# Patient Record
Sex: Female | Born: 1985 | Race: Black or African American | Hispanic: No | Marital: Married | Smoking: Former smoker
Health system: Southern US, Community
[De-identification: ages and names within clinical notes are randomized; demographics above are authoritative.]

## PROBLEM LIST (undated history)

## (undated) DIAGNOSIS — E039 Hypothyroidism, unspecified: Secondary | ICD-10-CM

## (undated) HISTORY — PX: WISDOM TOOTH EXTRACTION: SHX21

---

## 2015-03-09 LAB — OB RESULTS CONSOLE RPR: RPR: NONREACTIVE

## 2015-03-09 LAB — OB RESULTS CONSOLE RUBELLA ANTIBODY, IGM: Rubella: IMMUNE

## 2015-03-09 LAB — OB RESULTS CONSOLE GC/CHLAMYDIA
Chlamydia: NEGATIVE
Gonorrhea: NEGATIVE

## 2015-03-09 LAB — OB RESULTS CONSOLE ABO/RH: RH Type: POSITIVE

## 2015-03-09 LAB — OB RESULTS CONSOLE HEPATITIS B SURFACE ANTIGEN: Hepatitis B Surface Ag: NEGATIVE

## 2015-03-09 LAB — OB RESULTS CONSOLE HIV ANTIBODY (ROUTINE TESTING): HIV: NONREACTIVE

## 2015-03-09 LAB — OB RESULTS CONSOLE ANTIBODY SCREEN: ANTIBODY SCREEN: NEGATIVE

## 2015-04-18 ENCOUNTER — Other Ambulatory Visit (HOSPITAL_COMMUNITY): Payer: Self-pay | Admitting: Obstetrics and Gynecology

## 2015-04-18 DIAGNOSIS — Z3689 Encounter for other specified antenatal screening: Secondary | ICD-10-CM

## 2015-04-18 DIAGNOSIS — Z3A26 26 weeks gestation of pregnancy: Secondary | ICD-10-CM

## 2015-04-18 DIAGNOSIS — O283 Abnormal ultrasonic finding on antenatal screening of mother: Secondary | ICD-10-CM

## 2015-04-20 ENCOUNTER — Ambulatory Visit (HOSPITAL_COMMUNITY): Payer: Self-pay

## 2015-05-01 ENCOUNTER — Ambulatory Visit (HOSPITAL_COMMUNITY)
Admission: RE | Admit: 2015-05-01 | Discharge: 2015-05-01 | Disposition: A | Discharge status: Home / Self Care | Payer: BLUE CROSS/BLUE SHIELD | Source: Ambulatory Visit | Attending: Obstetrics and Gynecology | Admitting: Obstetrics and Gynecology

## 2015-05-01 ENCOUNTER — Encounter (HOSPITAL_COMMUNITY): Payer: Self-pay

## 2015-05-01 DIAGNOSIS — O358XX0 Maternal care for other (suspected) fetal abnormality and damage, not applicable or unspecified: Secondary | ICD-10-CM | POA: Insufficient documentation

## 2015-05-01 DIAGNOSIS — Z3689 Encounter for other specified antenatal screening: Secondary | ICD-10-CM

## 2015-05-01 DIAGNOSIS — Z36 Encounter for antenatal screening of mother: Secondary | ICD-10-CM | POA: Insufficient documentation

## 2015-05-01 DIAGNOSIS — Z3A29 29 weeks gestation of pregnancy: Secondary | ICD-10-CM | POA: Diagnosis not present

## 2015-05-01 DIAGNOSIS — O99283 Endocrine, nutritional and metabolic diseases complicating pregnancy, third trimester: Secondary | ICD-10-CM | POA: Diagnosis not present

## 2015-05-01 DIAGNOSIS — Z3A26 26 weeks gestation of pregnancy: Secondary | ICD-10-CM

## 2015-05-01 DIAGNOSIS — O283 Abnormal ultrasonic finding on antenatal screening of mother: Secondary | ICD-10-CM

## 2015-05-01 HISTORY — DX: Hypothyroidism, unspecified: E03.9

## 2015-05-21 NOTE — L&D Delivery Note (Signed)
Delivery Note At 10:55 AM a viable female, "Mallory Munoz", was delivered via Vaginal, Spontaneous Delivery (Presentation: ; Occiput Anterior).  APGAR: 9, 9; weight  .   Placenta status: Intact, Spontaneous.  Cord: 3 vessels with the following complications: None.  Cord pH: NA  Anesthesia: Epidural  Episiotomy: None Lacerations: None Suture Repair: None Est. Blood Loss (mL): 50  Mom to postpartum.  Baby to Couplet care / Skin to Skin. Family plans inpatient circumcision. Patient plans Mirena for contraception.  Rickayla Wieland 07/24/2015, 11:25 AM

## 2015-06-27 LAB — OB RESULTS CONSOLE GBS: STREP GROUP B AG: POSITIVE

## 2015-07-23 ENCOUNTER — Inpatient Hospital Stay (HOSPITAL_COMMUNITY)
Admission: AD | Admit: 2015-07-23 | Discharge: 2015-07-25 | DRG: 775 | Disposition: A | Discharge status: Home / Self Care | Payer: BLUE CROSS/BLUE SHIELD | Source: Ambulatory Visit | Attending: Obstetrics and Gynecology | Admitting: Obstetrics and Gynecology

## 2015-07-23 DIAGNOSIS — D649 Anemia, unspecified: Secondary | ICD-10-CM | POA: Diagnosis not present

## 2015-07-23 DIAGNOSIS — O9081 Anemia of the puerperium: Principal | ICD-10-CM | POA: Diagnosis not present

## 2015-07-23 DIAGNOSIS — O99824 Streptococcus B carrier state complicating childbirth: Secondary | ICD-10-CM | POA: Diagnosis present

## 2015-07-23 DIAGNOSIS — Z3A39 39 weeks gestation of pregnancy: Secondary | ICD-10-CM

## 2015-07-23 DIAGNOSIS — B951 Streptococcus, group B, as the cause of diseases classified elsewhere: Secondary | ICD-10-CM | POA: Diagnosis present

## 2015-07-23 DIAGNOSIS — E039 Hypothyroidism, unspecified: Secondary | ICD-10-CM | POA: Diagnosis present

## 2015-07-23 DIAGNOSIS — Z87891 Personal history of nicotine dependence: Secondary | ICD-10-CM

## 2015-07-23 DIAGNOSIS — Z6839 Body mass index (BMI) 39.0-39.9, adult: Secondary | ICD-10-CM

## 2015-07-23 DIAGNOSIS — O99284 Endocrine, nutritional and metabolic diseases complicating childbirth: Secondary | ICD-10-CM | POA: Diagnosis present

## 2015-07-23 DIAGNOSIS — O99214 Obesity complicating childbirth: Secondary | ICD-10-CM | POA: Diagnosis present

## 2015-07-24 ENCOUNTER — Inpatient Hospital Stay (HOSPITAL_COMMUNITY): Payer: BLUE CROSS/BLUE SHIELD | Admitting: Anesthesiology

## 2015-07-24 ENCOUNTER — Encounter (HOSPITAL_COMMUNITY): Payer: Self-pay

## 2015-07-24 DIAGNOSIS — Z87891 Personal history of nicotine dependence: Secondary | ICD-10-CM | POA: Diagnosis not present

## 2015-07-24 DIAGNOSIS — B951 Streptococcus, group B, as the cause of diseases classified elsewhere: Secondary | ICD-10-CM | POA: Diagnosis present

## 2015-07-24 DIAGNOSIS — D649 Anemia, unspecified: Secondary | ICD-10-CM | POA: Diagnosis not present

## 2015-07-24 DIAGNOSIS — O99824 Streptococcus B carrier state complicating childbirth: Secondary | ICD-10-CM | POA: Diagnosis present

## 2015-07-24 DIAGNOSIS — Z6839 Body mass index (BMI) 39.0-39.9, adult: Secondary | ICD-10-CM | POA: Diagnosis not present

## 2015-07-24 DIAGNOSIS — O99284 Endocrine, nutritional and metabolic diseases complicating childbirth: Secondary | ICD-10-CM | POA: Diagnosis present

## 2015-07-24 DIAGNOSIS — E039 Hypothyroidism, unspecified: Secondary | ICD-10-CM | POA: Diagnosis present

## 2015-07-24 DIAGNOSIS — O9081 Anemia of the puerperium: Secondary | ICD-10-CM | POA: Diagnosis not present

## 2015-07-24 DIAGNOSIS — O99214 Obesity complicating childbirth: Secondary | ICD-10-CM | POA: Diagnosis present

## 2015-07-24 DIAGNOSIS — Z3A39 39 weeks gestation of pregnancy: Secondary | ICD-10-CM | POA: Diagnosis not present

## 2015-07-24 LAB — RPR: RPR: NONREACTIVE

## 2015-07-24 LAB — TYPE AND SCREEN
ABO/RH(D): A POS
Antibody Screen: NEGATIVE

## 2015-07-24 LAB — CBC
HEMATOCRIT: 26.5 % — AB (ref 36.0–46.0)
Hemoglobin: 8 g/dL — ABNORMAL LOW (ref 12.0–15.0)
MCH: 21.2 pg — AB (ref 26.0–34.0)
MCHC: 30.2 g/dL (ref 30.0–36.0)
MCV: 70.1 fL — AB (ref 78.0–100.0)
Platelets: 318 10*3/uL (ref 150–400)
RBC: 3.78 MIL/uL — AB (ref 3.87–5.11)
RDW: 16.7 % — ABNORMAL HIGH (ref 11.5–15.5)
WBC: 6.1 10*3/uL (ref 4.0–10.5)

## 2015-07-24 LAB — ABO/RH: ABO/RH(D): A POS

## 2015-07-24 MED ORDER — LACTATED RINGERS IV SOLN: 500.0000 mL | INTRAVENOUS | Status: DC | PRN

## 2015-07-24 MED ORDER — EPHEDRINE 5 MG/ML INJ
10.0000 mg | INTRAVENOUS | Status: DC | PRN
Start: 2015-07-24 — End: 2015-07-24
  Filled 2015-07-24: qty 2

## 2015-07-24 MED ORDER — DIPHENHYDRAMINE HCL 25 MG PO CAPS
25.0000 mg | ORAL_CAPSULE | Freq: Four times a day (QID) | ORAL | Status: DC | PRN
Start: 2015-07-24 — End: 2015-07-24

## 2015-07-24 MED ORDER — OXYTOCIN 10 UNIT/ML IJ SOLN
1.0000 m[IU]/min | INTRAVENOUS | Status: DC
  Administered 2015-07-24: 1 m[IU]/min via INTRAVENOUS
  Filled 2015-07-24: qty 10

## 2015-07-24 MED ORDER — DIBUCAINE 1 % RE OINT: 1.0000 "application " | TOPICAL_OINTMENT | RECTAL | Status: DC | PRN

## 2015-07-24 MED ORDER — TETANUS-DIPHTH-ACELL PERTUSSIS 5-2.5-18.5 LF-MCG/0.5 IM SUSP: 0.5000 mL | Freq: Once | INTRAMUSCULAR | Status: DC

## 2015-07-24 MED ORDER — SIMETHICONE 80 MG PO CHEW: 80.0000 mg | CHEWABLE_TABLET | ORAL | Status: DC | PRN

## 2015-07-24 MED ORDER — ONDANSETRON HCL 4 MG/2ML IJ SOLN: 4.0000 mg | Freq: Four times a day (QID) | INTRAMUSCULAR | Status: DC | PRN

## 2015-07-24 MED ORDER — LACTATED RINGERS IV SOLN
INTRAVENOUS | Status: DC
  Administered 2015-07-24 (×2): via INTRAVENOUS

## 2015-07-24 MED ORDER — WITCH HAZEL-GLYCERIN EX PADS: 1.0000 "application " | MEDICATED_PAD | CUTANEOUS | Status: DC | PRN

## 2015-07-24 MED ORDER — TERBUTALINE SULFATE 1 MG/ML IJ SOLN
0.2500 mg | Freq: Once | INTRAMUSCULAR | Status: DC | PRN
  Filled 2015-07-24: qty 1

## 2015-07-24 MED ORDER — CITRIC ACID-SODIUM CITRATE 334-500 MG/5ML PO SOLN: 30.0000 mL | ORAL | Status: DC | PRN

## 2015-07-24 MED ORDER — PENICILLIN G POTASSIUM 5000000 UNITS IJ SOLR
2.5000 10*6.[IU] | INTRAVENOUS | Status: DC
  Administered 2015-07-24: 2.5 10*6.[IU] via INTRAVENOUS
  Filled 2015-07-24 (×3): qty 2.5

## 2015-07-24 MED ORDER — PRENATAL MULTIVITAMIN CH: 1.0000 | ORAL_TABLET | Freq: Every day | ORAL | Status: DC

## 2015-07-24 MED ORDER — ZOLPIDEM TARTRATE 5 MG PO TABS: 5.0000 mg | ORAL_TABLET | Freq: Every evening | ORAL | Status: DC | PRN

## 2015-07-24 MED ORDER — ONDANSETRON HCL 4 MG/2ML IJ SOLN: 4.0000 mg | INTRAMUSCULAR | Status: DC | PRN

## 2015-07-24 MED ORDER — PHENYLEPHRINE 40 MCG/ML (10ML) SYRINGE FOR IV PUSH (FOR BLOOD PRESSURE SUPPORT): 80.0000 ug | PREFILLED_SYRINGE | INTRAVENOUS | Status: DC | PRN

## 2015-07-24 MED ORDER — LACTATED RINGERS IV SOLN: 500.0000 mL | Freq: Once | INTRAVENOUS | Status: DC

## 2015-07-24 MED ORDER — OXYCODONE HCL 5 MG PO TABS: 5.0000 mg | ORAL_TABLET | ORAL | Status: DC | PRN

## 2015-07-24 MED ORDER — LANOLIN HYDROUS EX OINT
TOPICAL_OINTMENT | CUTANEOUS | Status: DC | PRN
Start: 2015-07-24 — End: 2015-07-24

## 2015-07-24 MED ORDER — BENZOCAINE-MENTHOL 20-0.5 % EX AERO: 1.0000 "application " | INHALATION_SPRAY | CUTANEOUS | Status: DC | PRN

## 2015-07-24 MED ORDER — PRENATAL MULTIVITAMIN CH
1.0000 | ORAL_TABLET | Freq: Every day | ORAL | Status: DC
  Administered 2015-07-24 – 2015-07-25 (×2): 1 via ORAL
  Filled 2015-07-24 (×2): qty 1

## 2015-07-24 MED ORDER — PENICILLIN G POTASSIUM 5000000 UNITS IJ SOLR
5.0000 10*6.[IU] | Freq: Once | INTRAVENOUS | Status: AC
  Administered 2015-07-24: 5 10*6.[IU] via INTRAVENOUS
  Filled 2015-07-24: qty 5

## 2015-07-24 MED ORDER — LIDOCAINE HCL (PF) 1 % IJ SOLN
INTRAMUSCULAR | Status: DC | PRN
  Administered 2015-07-24 (×2): 5 mL via EPIDURAL

## 2015-07-24 MED ORDER — OXYTOCIN 10 UNIT/ML IJ SOLN: 1.0000 m[IU]/min | INTRAVENOUS | Status: DC

## 2015-07-24 MED ORDER — OXYCODONE HCL 5 MG PO TABS: 10.0000 mg | ORAL_TABLET | ORAL | Status: DC | PRN

## 2015-07-24 MED ORDER — SENNOSIDES-DOCUSATE SODIUM 8.6-50 MG PO TABS: 2.0000 | ORAL_TABLET | ORAL | Status: DC

## 2015-07-24 MED ORDER — BENZOCAINE-MENTHOL 20-0.5 % EX AERO
1.0000 "application " | INHALATION_SPRAY | CUTANEOUS | Status: DC | PRN
  Administered 2015-07-24: 1 via TOPICAL
  Filled 2015-07-24: qty 56

## 2015-07-24 MED ORDER — LIDOCAINE HCL (PF) 1 % IJ SOLN
30.0000 mL | INTRAMUSCULAR | Status: DC | PRN
Start: 2015-07-24 — End: 2015-07-24
  Filled 2015-07-24: qty 30

## 2015-07-24 MED ORDER — ONDANSETRON HCL 4 MG PO TABS: 4.0000 mg | ORAL_TABLET | ORAL | Status: DC | PRN

## 2015-07-24 MED ORDER — LANOLIN HYDROUS EX OINT: TOPICAL_OINTMENT | CUTANEOUS | Status: DC | PRN

## 2015-07-24 MED ORDER — PHENYLEPHRINE 40 MCG/ML (10ML) SYRINGE FOR IV PUSH (FOR BLOOD PRESSURE SUPPORT)
80.0000 ug | PREFILLED_SYRINGE | INTRAVENOUS | Status: DC | PRN
  Filled 2015-07-24: qty 2
  Filled 2015-07-24: qty 20

## 2015-07-24 MED ORDER — IBUPROFEN 600 MG PO TABS: 600.0000 mg | ORAL_TABLET | Freq: Four times a day (QID) | ORAL | Status: DC

## 2015-07-24 MED ORDER — FENTANYL 2.5 MCG/ML BUPIVACAINE 1/10 % EPIDURAL INFUSION (WH - ANES)
14.0000 mL/h | INTRAMUSCULAR | Status: DC | PRN
  Administered 2015-07-24 (×2): 14 mL/h via EPIDURAL
  Filled 2015-07-24: qty 125

## 2015-07-24 MED ORDER — OXYTOCIN BOLUS FROM INFUSION
500.0000 mL | INTRAVENOUS | Status: DC
  Administered 2015-07-24: 500 mL via INTRAVENOUS

## 2015-07-24 MED ORDER — OXYCODONE HCL 5 MG PO TABS
10.0000 mg | ORAL_TABLET | ORAL | Status: DC | PRN
Start: 2015-07-24 — End: 2015-07-24

## 2015-07-24 MED ORDER — ACETAMINOPHEN 325 MG PO TABS
650.0000 mg | ORAL_TABLET | ORAL | Status: DC | PRN
Start: 2015-07-24 — End: 2015-07-24

## 2015-07-24 MED ORDER — OXYCODONE HCL 5 MG PO TABS
5.0000 mg | ORAL_TABLET | ORAL | Status: DC | PRN
  Administered 2015-07-25: 5 mg via ORAL
  Filled 2015-07-24: qty 1

## 2015-07-24 MED ORDER — DIPHENHYDRAMINE HCL 25 MG PO CAPS: 25.0000 mg | ORAL_CAPSULE | Freq: Four times a day (QID) | ORAL | Status: DC | PRN

## 2015-07-24 MED ORDER — LACTATED RINGERS IV SOLN: 2.5000 [IU]/h | INTRAVENOUS | Status: DC

## 2015-07-24 MED ORDER — IBUPROFEN 600 MG PO TABS
600.0000 mg | ORAL_TABLET | Freq: Four times a day (QID) | ORAL | Status: DC
  Administered 2015-07-24 – 2015-07-25 (×4): 600 mg via ORAL
  Filled 2015-07-24 (×4): qty 1

## 2015-07-24 MED ORDER — PHENYLEPHRINE 40 MCG/ML (10ML) SYRINGE FOR IV PUSH (FOR BLOOD PRESSURE SUPPORT)
80.0000 ug | PREFILLED_SYRINGE | INTRAVENOUS | Status: DC | PRN
Start: 2015-07-24 — End: 2015-07-24
  Filled 2015-07-24: qty 2
  Filled 2015-07-24: qty 20

## 2015-07-24 MED ORDER — FENTANYL 2.5 MCG/ML BUPIVACAINE 1/10 % EPIDURAL INFUSION (WH - ANES): 14.0000 mL/h | INTRAMUSCULAR | Status: DC | PRN

## 2015-07-24 MED ORDER — FENTANYL CITRATE (PF) 100 MCG/2ML IJ SOLN
50.0000 ug | INTRAMUSCULAR | Status: DC | PRN
  Administered 2015-07-24 (×2): 100 ug via INTRAVENOUS
  Filled 2015-07-24 (×2): qty 2

## 2015-07-24 MED ORDER — SENNOSIDES-DOCUSATE SODIUM 8.6-50 MG PO TABS
2.0000 | ORAL_TABLET | ORAL | Status: DC
  Filled 2015-07-24: qty 2

## 2015-07-24 MED ORDER — EPHEDRINE 5 MG/ML INJ: 10.0000 mg | INTRAVENOUS | Status: DC | PRN

## 2015-07-24 MED ORDER — DIPHENHYDRAMINE HCL 50 MG/ML IJ SOLN: 12.5000 mg | INTRAMUSCULAR | Status: DC | PRN

## 2015-07-24 MED ORDER — ACETAMINOPHEN 325 MG PO TABS: 650.0000 mg | ORAL_TABLET | ORAL | Status: DC | PRN

## 2015-07-24 NOTE — Consults (Signed)
  Anesthesia Pain Consult Note  Patient: Riesa PopeShe-Ra Kneebone, 30 y.o., female  Consult Requested by: Osborn CohoAngela Roberts, MD  Reason for Consult: Crna round  Level of Consciousness: alert and oriented  Pain: 5 on pain scale of 1-10   Goal : 3 on pain scale of 1-10    [address]            O  Salome ArntSterling, Shelma Eiben Marie 07/24/2015

## 2015-07-24 NOTE — Progress Notes (Signed)
  Subjective: Comfortable with epidural.  Mother and FOB at bedside.  Objective: BP 132/73 mmHg  Pulse 102  Temp(Src) 98.2 F (36.8 C) (Oral)  Resp 16  Ht 5\' 4"  (1.626 m)  Wt 102.967 kg (227 lb)  BMI 38.95 kg/m2  SpO2 99%  LMP 10/20/2014      FHT: Category 2--decreased variability, no decels. UC:   regular, every 2-3 minutes SVE:   Dilation: 7.5 Effacement (%): 80 Station: -2 Exam by:: Doloris HallJenny Middleton RN  Pitocin at 1 mu/min  Assessment:  IUP at 39 4/7 weeks Active/transitional labor GBS positive Hx hypothyroidisma  Plan: Continue current care Await increased pressure/urge to push.  Nigel BridgemanLATHAM, Mallory Munoz CNM 07/24/2015, 8:05 AM

## 2015-07-24 NOTE — Progress Notes (Signed)
  Subjective: Feeling slightly more pressure, but no urge to push.  Objective: BP 127/79 mmHg  Pulse 99  Temp(Src) 98.2 F (36.8 C) (Oral)  Resp 18  Ht 5\' 4"  (1.626 m)  Wt 102.967 kg (227 lb)  BMI 38.95 kg/m2  SpO2 99%  LMP 10/20/2014      FHT: Category 1 UC:   regular, every 3 minutes SVE:   Dilation: 10 Effacement (%): 80 Station: -1, 0 Exam by:: CNM Nigel BridgemanVicki Jasmine Mcbeth Pitocin at 4 mu/min  Assessment:  2nd stage labor, but no urge to push. GBS positive  Plan: Await increased pressure/urge to push. Continue pit aug for facilitation of UC power. Position with peanut ball to aid rotation/descent.  Nigel BridgemanLATHAM, Caydence Enck CNM 07/24/2015, 9:53 AM

## 2015-07-24 NOTE — MAU Note (Signed)
Pt presents complaining of contractions every 10-3615minutes. Denies bleeding or leaking. Reports good fetal movement. 3cm in office last week.

## 2015-07-24 NOTE — Consults (Signed)
  Anesthesia Pain Consult Note  Patient: Mallory Munoz, 30 y.o., female  Consult Requested by: Osborn CohoAngela Roberts, MD  Reason for Consult: [facility] C[facility] Anesthesia Rounding Pain goal 3 Pain level 2 Epidural placed

## 2015-07-24 NOTE — Anesthesia Procedure Notes (Signed)
Epidural Patient location during procedure: OB Start time: 07/24/2015 5:50 AM End time: 07/24/2015 5:55 AM  Staffing Anesthesiologist: Ronelle NighEWELL, Aariana Shankland Performed by: anesthesiologist   Preanesthetic Checklist Completed: patient identified, site marked, surgical consent, pre-op evaluation, timeout performed, IV checked, risks and benefits discussed and monitors and equipment checked  Epidural Patient position: sitting Prep: site prepped and draped and DuraPrep Patient monitoring: continuous pulse ox and blood pressure Approach: midline Location: L3-L4 Injection technique: LOR air  Needle:  Needle type: Tuohy  Needle gauge: 17 G Needle length: 9 cm and 9 Needle insertion depth: 6 cm Catheter type: closed end flexible Catheter size: 19 Gauge Catheter at skin depth: 11 cm Test dose: negative  Assessment Sensory level: T10 Events: blood not aspirated, injection not painful, no injection resistance, negative IV test and no paresthesia  Additional Notes Patient identified. Risks/Benefits/Options discussed with patient including but not limited to bleeding, infection, nerve damage, paralysis, failed block, incomplete pain control, headache, blood pressure changes, nausea, vomiting, reactions to medication both or allergic, itching and postpartum back pain. Confirmed with bedside nurse the patient's most recent platelet count. Confirmed with patient that they are not currently taking any anticoagulation, have any bleeding history or any family history of bleeding disorders. Patient expressed understanding and wished to proceed. All questions were answered. Sterile technique was used throughout the entire procedure. Please see nursing notes for vital signs. Test dose was given through epidural catheter and negative prior to continuing to dose epidural or start infusion. Warning signs of high block given to the patient including shortness of breath, tingling/numbness in hands, complete motor block,  or any concerning symptoms with instructions to call for help. Patient was given instructions on fall risk and not to get out of bed. All questions and concerns addressed with instructions to call with any issues or inadequate analgesia.

## 2015-07-24 NOTE — Anesthesia Postprocedure Evaluation (Signed)
Anesthesia Post Note  Patient: Mallory Munoz  Procedure(s) Performed: * No procedures listed *  Patient location during evaluation: Mother Baby Anesthesia Type: Epidural Level of consciousness: awake and alert Pain management: pain level controlled Vital Signs Assessment: post-procedure vital signs reviewed and stable Respiratory status: spontaneous breathing, nonlabored ventilation and respiratory function stable Cardiovascular status: stable Postop Assessment: no headache, no backache and epidural receding Anesthetic complications: no    Last Vitals:  Filed Vitals:   07/24/15 1348 07/24/15 1718  BP: 127/63 137/86  Pulse: 88 92  Temp: 37.2 C 36.9 C  Resp: 18 18    Last Pain:  Filed Vitals:   07/24/15 2028  PainSc: 0-No pain                 Ata Pecha

## 2015-07-24 NOTE — Progress Notes (Signed)
Mallory Munoz MRN: [id number]  Subjective: -Patient resting in bed.  Reports coping well with contractions, but they are not very frequent. Family at bedside.  Objective: BP 128/76 mmHg  Pulse 104  Temp(Src) 98.2 F (36.8 C) (Oral)  Resp 18  Ht 5\' 4"  (1.626 m)  Wt 102.967 kg (227 lb)  BMI 38.95 kg/m2  LMP 10/20/2014      Fetal Monitoring: FHT: 140 bpm, Mod Var, -Decels, +Accels UC: palpates mild    Vaginal Exam: SVE:   Dilation: 5.5 Effacement (%): 70 Station: -3 Exam by:: Mallory Munoz, CNM Membranes:Bulging Internal Monitors: None  Augmentation/Induction: Pitocin:None Cytotec: None  Assessment:  IUP at 39.4wks Cat I FT  Early Active Labor GBS Positive  Plan: -Discussed POC to include AROM after 2nd dose of PCN -Discussed AROM r/b including increased risk of infection, cord prolapse, fetal intolerance, and decreased labor time. No questions or concerns and patient desires to proceed with AROM.  -Okay for epidural when desired -Continue other mgmt as ordered  Mallory CavaJessica L Pantera Winterrowd,MSN, CNM 07/24/2015, 2:27 AM

## 2015-07-24 NOTE — H&P (Signed)
Mallory Munoz is a 30 y.o. female, G4P2002 at 39.4 weeks, presenting for contractions.  Patient reports contracting since yesterday and is now having them every 10-15 minutes.  Patient endorses active fetus and denies LOF and VB.  Patient is GBS positive and desires an epidural.  Patient pregnancy history significant for Late to [facility] and hypothyroidism history.   Patient Active Problem List   Diagnosis Date Noted  . Labor and delivery, indication for care 07/24/2015    History of present pregnancy: Patient entered care at 20.4 weeks.   EDC of 07/27/2015 was established by Definite LMP of 10/20/2014.   Anatomy scan:  20.5 weeks, with normal findings and an posterior placenta.   Additional [place] evaluations:  U/S: TA images obtained. Unsure LMP. Singleton pregnancy. vertex presentation. Posterior placenta. Placental edge is 3.8cm from internal os, normal. Cervix appears closed-measured transabdominally 3.0cm. Fluid appears normal. Female gender. Ovaries/adnexas unremarkable.  24.5: All parts still not visualized-sent to MFM Significant prenatal events: 1st Trimester: Late Entry to [facility] at 20wks 2nd Trimester: No complaints 3rd Trimester:   Patient c/o some vaginal dryness, cramping, and LoF. Patient reports mild dysuria Last evaluation:  07/19/2015 by Mattie Marlin, CNM.  VE 3/70/-2, FHR 130, BP 118/62, Wt 227lbs TWG: 5lbs  OB History    Gravida Para Term Preterm AB TAB SAB Ectopic Multiple Living   0   2     11/2005-Female via SVD 01/2012-Female via SVD  Past Medical History  Diagnosis Date  . Hypothyroid    Past Surgical History  Procedure Laterality Date  . Wisdom tooth extraction     Family History: family history is not on file. Social History:  reports that she quit smoking about a year ago. She does not have any smokeless tobacco history on file. She reports that she does not drink alcohol or use illicit drugs.   Prenatal Transfer Tool  Maternal Diabetes:  No Genetic Screening: Declined Maternal Ultrasounds/Referrals: Normal Fetal Ultrasounds or other Referrals:  Referred to Materal Fetal Medicine  Maternal Substance Abuse:  No Significant Maternal Medications:  None Significant Maternal Lab Results: Lab values include: Group B Strep positive    ROS:  +FM, +Ctx, -LoF-VB  No Known Allergies   Dilation: 5 Effacement (%): 70 Station: -3 Exam by:: J. Semaya Vida CNM Blood pressure 119/86, pulse 130, temperature 98.4 F (36.9 C), temperature source Oral, resp. rate 18, height  (1.626 m), weight 102.967 kg (227 lb), last menstrual period 10/20/2014.  Physical Exam  Vitals reviewed. Constitutional: She is oriented to person, place, and time. She appears well-developed. No distress.  Obese  HENT:  Head: Normocephalic and atraumatic.  Eyes: Conjunctivae are normal.  Neck: Normal range of motion.  Cardiovascular: Normal rate.   Respiratory: Effort normal.  GI: Soft.  Musculoskeletal: Normal range of motion.  Neurological: She is alert and oriented to person, place, and time.  Skin: Skin is warm and dry.    Leopolds: EFW: 7 3/4lbs Presentation: Vertex  FHR: 150 bpm, Mod Var, -Decels, +Accels UCs:  Irregular  Prenatal labs: ABO, Rh: --/--/A POS, A POS (03/06 0105) Antibody: NEG (03/06 0105) Rubella:  Immune RPR: Nonreactive (10/20 0000)  HBsAg: Negative (10/20 0000)  HIV: Non-reactive (10/20 0000)  GBS: Positive (02/07 0000) Sickle cell/Hgb electrophoresis:  Normal Pap:  None GC:  Negative Chlamydia:  Negative Other:  TSH- 0.611    Assessment IUP at 39.4wks Cat I FT Active Labor GBS Positive Hypothyroidisim   [facility]: [facility]  Routine Labor and Delivery Orders per [facility] Protocol In room to complete assessment and discuss POC: Okay for epidural once desired Will give PCN for GBS prophylaxis Dr.TC to be updated as appropriate  Joellyn QuailsJessica L EmlyCNM, MSN 07/24/2015, 12:46 AM

## 2015-07-24 NOTE — Anesthesia Preprocedure Evaluation (Signed)
Anesthesia Evaluation  Patient identified by MRN, date of birth, ID band Patient awake    Reviewed: Allergy & Precautions, H&P , NPO status , Patient's Chart, lab work & pertinent test results  Airway Mallampati: II  TM Distance: >3 FB Neck ROM: full    Dental no notable dental hx. (+) Dental Advisory Given, Teeth Intact   Pulmonary neg pulmonary ROS, former smoker,    Pulmonary exam normal breath sounds clear to auscultation       Cardiovascular Exercise Tolerance: Good negative cardio ROS Normal cardiovascular exam Rhythm:regular Rate:Normal     Neuro/Psych negative neurological ROS  negative psych ROS   GI/Hepatic negative GI ROS, Neg liver ROS,   Endo/Other  negative endocrine ROSHypothyroidism Morbid obesity  Renal/GU negative Renal ROS  negative genitourinary   Musculoskeletal   Abdominal   Peds  Hematology negative hematology ROS (+) anemia , hgb 8   Anesthesia Other Findings   Reproductive/Obstetrics negative OB ROS                             Anesthesia Physical Anesthesia Plan  ASA: III  Anesthesia Plan: Epidural   Post-op Pain Management:    Induction:   Airway Management Planned:   Additional Equipment:   Intra-op Plan:   Post-operative Plan:   Informed Consent: I have reviewed the patients History and Physical, chart, labs and discussed the procedure including the risks, benefits and alternatives for the proposed anesthesia with the patient or authorized representative who has indicated his/her understanding and acceptance.   Dental Advisory Given  Plan Discussed with: CRNA and Surgeon  Anesthesia Plan Comments:         Anesthesia Quick Evaluation

## 2015-07-24 NOTE — Progress Notes (Signed)
Mallory Munoz MRN: [id number]  Subjective: -Patient resting in bed.  Reports comfort with epidural, but states she is just doing "okay."  Family remain at bedside.   Objective: BP 128/72 mmHg  Pulse 100  Temp(Src) 98.2 F (36.8 C) (Oral)  Resp 18  Ht 5\' 4"  (1.626 m)  Wt 102.967 kg (227 lb)  BMI 38.95 kg/m2  SpO2 99%  LMP 10/20/2014      Fetal Monitoring: FHT: 145 bpm, Mod Var, -Decels, +Accels UC: Q1-513min, palpates mild to moderate    Vaginal Exam: SVE:   Dilation: 5.5 Effacement (%): 80 Station: -3 Exam by:: J. Shelly CossEmly, CNM Membranes:AROM of moderate amt Light MSF Internal Monitors: None  Augmentation/Induction: Pitocin:Initiated Cytotec: None  Assessment:  IUP at 39.4wks Cat I FT  Active Labor GBS Positive  Plan: -Agrees to AROM -Will initiate pitocin at low dose to promote consistently moderate to strong contractions -Discussed MSAF and potential risks associated with this including fetal issues with transition, NICU team presence, deep suctioning, supplementary O2, and NICU admission -Q/C addressed -Start pitocin at 101mUn/min x 701mUn/min -Continue other mgmt as ordered -Dr. Delrae Sawyers. Cole updated -Report given to V. Emilee HeroLatham, CNM  Mallory CopaJessica L Ambrosia Wisnewski,MSN, CNM 07/24/2015, 6:55 AM

## 2015-07-25 LAB — CBC
HCT: 24.7 % — ABNORMAL LOW (ref 36.0–46.0)
Hemoglobin: 7.5 g/dL — ABNORMAL LOW (ref 12.0–15.0)
MCH: 21.2 pg — ABNORMAL LOW (ref 26.0–34.0)
MCHC: 30.4 g/dL (ref 30.0–36.0)
MCV: 69.8 fL — AB (ref 78.0–100.0)
PLATELETS: 265 10*3/uL (ref 150–400)
RBC: 3.54 MIL/uL — ABNORMAL LOW (ref 3.87–5.11)
RDW: 16.8 % — AB (ref 11.5–15.5)
WBC: 7.5 10*3/uL (ref 4.0–10.5)

## 2015-07-25 MED ORDER — IBUPROFEN 600 MG PO TABS: 600.0000 mg | ORAL_TABLET | Freq: Four times a day (QID) | ORAL | Status: AC | PRN

## 2015-07-25 MED ORDER — OXYCODONE HCL 5 MG PO TABS: 5.0000 mg | ORAL_TABLET | ORAL | Status: AC | PRN

## 2015-07-25 NOTE — Discharge Summary (Signed)
OB Discharge Summary     Patient Name: Mallory Munoz DOB: [birth year] MRN: [id number]  Date of admission: 07/23/2015 Delivering MD: Nigel BridgemanLATHAM, VICKI   Date of discharge: 07/25/2015  Admitting diagnosis: 39 WEEKS CTX Intrauterine pregnancy: 3250w4d     Secondary diagnosis:  Principal Problem:   Vaginal delivery Active Problems:   Labor and delivery, indication for care   Positive GBS test  Additional problems: none     Discharge diagnosis: Term Pregnancy Delivered and Anemia                                                                                                Post partum procedures:none  Augmentation: AROM and Pitocin  Complications: None  Hospital course:  Onset of Labor With Vaginal Delivery     30 y.o. yo [id number] at 2950w4d was admitted in Active Labor on 07/23/2015. Patient had an uncomplicated labor course as follows:  Membrane Rupture Time/Date: 6:41 AM ,07/24/2015   Intrapartum Procedures: Episiotomy: None [1]                                         Lacerations:  None [1]  Patient had a delivery of a Viable infant. 07/24/2015  Information for the patient's newborn:  Blinda LeatherwoodCooper, Boy Mallory Munoz [[id number]][[id number]]  Delivery Method: Vaginal, Spontaneous Delivery (Filed from Delivery Summary)    Pateint had an uncomplicated postpartum course.  She is ambulating, tolerating a regular diet, passing flatus, and urinating well. Patient is discharged home in stable condition on 07/25/2015.    Physical exam  Filed Vitals:   07/24/15 1348 07/24/15 1718 07/24/15 2330 07/25/15 0610  BP: 127/63 137/86 121/64 116/71  Pulse: 88 92 86 83  Temp: 98.9 F (37.2 C) 98.4 F (36.9 C) 98.2 F (36.8 C) 98.1 F (36.7 C)  TempSrc: Oral Oral Oral Oral  Resp: 18 18 18 18   Height:      Weight:      SpO2:       General: alert, cooperative and no distress Lochia: appropriate Uterus Laceration:  Intact/ Healing well  DVT Evaluation: No evidence of DVT seen on physical exam. Negative Homan's  sign. Labs: Lab Results  Component Value Date   WBC 7.5 07/25/2015   HGB 7.5* 07/25/2015   HCT 24.7* 07/25/2015   MCV 69.8* 07/25/2015   PLT 265 07/25/2015   No flowsheet data found.  Discharge instruction: per After Visit Summary and "Baby and Me Booklet".  After visit meds:    Medication List    TAKE these medications        ibuprofen 600 MG tablet  Commonly known as:  ADVIL,MOTRIN  Take 1 tablet (600 mg total) by mouth every 6 (six) hours as needed.     oxyCODONE 5 MG immediate release tablet  Commonly known as:  Oxy IR/ROXICODONE  Take 1 tablet (5 mg total) by mouth every 4 (four) hours as needed (pain scale 4-7).     PRENATAL VITAMIN PO  Take by mouth.  Diet: routine diet  Activity: Advance as tolerated. Pelvic rest for 6 weeks.   Outpatient follow WU:[facility] and Make an appointment for 5wk postpartum visit and 6 week contraception visit for Mirena Follow up Appt:No future appointments. Follow up Visit:No Follow-up on file.  Postpartum contraception: IUD Mirena  Newborn Data: Live born female  Birth Weight: 8 lb 11 oz (3940 g) APGAR: 9, 9  Baby Feeding: Bottle Disposition:home with mother   07/25/2015 Alphonzo Severance, CNM

## 2015-07-25 NOTE — Discharge Instructions (Signed)
Postpartum Care After Vaginal Delivery °After you deliver your newborn (postpartum period), the usual stay in the hospital is 24-72 hours. If there were problems with your labor or delivery, or if you have other medical problems, you might be in the hospital longer.  °While you are in the hospital, you will receive help and instructions on how to care for yourself and your newborn during the postpartum period.  °While you are in the hospital: °· Be sure to tell your nurses if you have pain or discomfort, as well as where you feel the pain and what makes the pain worse. °· If you had an incision made near your vagina (episiotomy) or if you had some tearing during delivery, the nurses may put ice packs on your episiotomy or tear. The ice packs may help to reduce the pain and swelling. °· If you are breastfeeding, you may feel uncomfortable contractions of your uterus for a couple of weeks. This is normal. The contractions help your uterus get back to normal size. °· It is normal to have some bleeding after delivery. °· For the first 1-3 days after delivery, the flow is red and the amount may be similar to a period. °· It is common for the flow to start and stop. °· In the first few days, you may pass some small clots. Let your nurses know if you begin to pass large clots or your flow increases. °· Do not  flush blood clots down the toilet before having the nurse look at them. °· During the next 3-10 days after delivery, your flow should become more watery and pink or brown-tinged in color. °· Ten to fourteen days after delivery, your flow should be a small amount of yellowish-white discharge. °· The amount of your flow will decrease over the first few weeks after delivery. Your flow may stop in 6-8 weeks. Most women have had their flow stop by 12 weeks after delivery. °· You should change your sanitary pads frequently. °· Wash your hands thoroughly with soap and water for at least 20 seconds after changing pads, using  the toilet, or before holding or feeding your newborn. °· You should feel like you need to empty your bladder within the first 6-8 hours after delivery. °· In case you become weak, lightheaded, or faint, call your nurse before you get out of bed for the first time and before you take a shower for the first time. °· Within the first few days after delivery, your breasts may begin to feel tender and full. This is called engorgement. Breast tenderness usually goes away within 48-72 hours after engorgement occurs. You may also notice milk leaking from your breasts. If you are not breastfeeding, do not stimulate your breasts. Breast stimulation can make your breasts produce more milk. °· Spending as much time as possible with your newborn is very important. During this time, you and your newborn can feel close and get to know each other. Having your newborn stay in your room (rooming in) will help to strengthen the bond with your newborn.  It will give you time to get to know your newborn and become comfortable caring for your newborn. °· Your hormones change after delivery. Sometimes the hormone changes can temporarily cause you to feel sad or tearful. These feelings should not last more than a few days. If these feelings last longer than that, you should talk to your caregiver. °· If desired, talk to your caregiver about methods of family planning or contraception. °·   Talk to your caregiver about immunizations. Your caregiver may want you to have the following immunizations before leaving the hospital:  Tetanus, diphtheria, and pertussis (Tdap) or tetanus and diphtheria (Td) immunization. It is very important that you and your family (including grandparents) or others caring for your newborn are up-to-date with the Tdap or Td immunizations. The Tdap or Td immunization can help protect your newborn from getting ill.  Rubella immunization.  Varicella (chickenpox) immunization.  Influenza immunization. You should  receive this annual immunization if you did not receive the immunization during your pregnancy.   This information is not intended to replace advice given to you by your health care provider. Make sure you discuss any questions you have with your health care provider.   Document Released: 03/03/2007 Document Revised: 01/29/2012 Document Reviewed: 01/01/2012 Elsevier Interactive Patient Education 2016 [employer]. Pregnancy and Anemia Anemia is a condition in which the concentration of red blood cells or hemoglobin in the blood is below normal. Hemoglobin is a substance in red blood cells that carries oxygen to the tissues of the body. Anemia results in not enough oxygen reaching these tissues.  Anemia during pregnancy is common because the fetus uses more iron and folic acid as it is developing. Your body may not produce enough red blood cells because of this. Also, during pregnancy, the liquid part of the blood (plasma) increases by about 50%, and the red blood cells increase by only 25%. This lowers the concentration of the red blood cells and creates a natural anemia-like situation.  CAUSES  The most common cause of anemia during pregnancy is not having enough iron in the body to make red blood cells (iron deficiency anemia). Other causes may include:  Folic acid deficiency.  Vitamin B12 deficiency.  Certain prescription or over-the-counter medicines.  Certain medical conditions or infections that destroy red blood cells.  A low platelet count and bleeding caused by antibodies that go through the placenta to the fetus from the mother's blood. SIGNS AND SYMPTOMS  Mild anemia may not be noticeable. If it becomes severe, symptoms may include:  Tiredness.  Shortness of breath, especially with exercise.  Weakness.  Fainting.  Pale looking skin.  Headaches.  Feeling a fast or irregular heartbeat (palpitations). DIAGNOSIS  The type of anemia is usually diagnosed from your family  and medical history and blood tests. TREATMENT  Treatment of anemia during pregnancy depends on the cause of the anemia. Treatment can include:  Supplements of iron, vitamin S06, or folic acid.  A blood transfusion. This may be needed if blood loss is severe.  Hospitalization. This may be needed if there is significant continual blood loss.  Dietary changes. HOME CARE INSTRUCTIONS   Follow your dietitian's or health care provider's dietary recommendations.  Increase your vitamin C intake. This will help the stomach absorb more iron.  Eat a diet rich in iron. This would include foods such as:  Liver.  Beef.  Whole grain bread.  Eggs.  Dried fruit.  Take iron and vitamins as directed by your health care provider.  Eat green leafy vegetables. These are a good source of folic acid. SEEK MEDICAL CARE IF:   You have frequent or lasting headaches.  You are looking pale.  You are bruising easily. SEEK IMMEDIATE MEDICAL CARE IF:   You have extreme weakness, shortness of breath, or chest pain.  You become dizzy or have trouble concentrating.  You have heavy vaginal bleeding.  You develop a rash.  You have  bloody or black, tarry stools.  You faint.  You vomit up blood.  You vomit repeatedly.  You have abdominal pain.  You have a fever or persistent symptoms for more than 2-3 days.  You have a fever and your symptoms suddenly get worse.  You are dehydrated. MAKE SURE YOU:   Understand these instructions.  Will watch your condition.  Will get help right away if you are not doing well or get worse.   This information is not intended to replace advice given to you by your health care provider. Make sure you discuss any questions you have with your health care provider.   Document Released: 05/03/2000 Document Revised: 02/24/2013 Document Reviewed: 12/16/2012 Elsevier Interactive Patient Education 2016 [employer] Iron-Rich Diet Iron is a mineral that  helps your body to produce hemoglobin. Hemoglobin is a protein in your red blood cells that carries oxygen to your body's tissues. Eating too little iron may cause you to feel weak and tired, and it can increase your risk for infection. Eating enough iron is necessary for your body's metabolism, muscle function, and nervous system. Iron is naturally found in many foods. It can also be added to foods or fortified in foods. There are two types of dietary iron:  Heme iron. Heme iron is absorbed by the body more easily than nonheme iron. Heme iron is found in meat, poultry, and fish.  Nonheme iron. Nonheme iron is found in dietary supplements, iron-fortified grains, beans, and vegetables. You may need to follow an iron-rich diet if:  You have been diagnosed with iron deficiency or iron-deficiency anemia.  You have a condition that prevents you from absorbing dietary iron, such as:  Infection in your intestines.  Celiac disease. This involves long-lasting (chronic) inflammation of your intestines.  You do not eat enough iron.  You eat a diet that is high in foods that impair iron absorption.  You have lost a lot of blood.  You have heavy bleeding during your menstrual cycle.  You are pregnant. WHAT IS MY PLAN? Your health care provider may help you to determine how much iron you need per day based on your condition. Generally, when a person consumes sufficient amounts of iron in the diet, the following iron needs are met:  Men.  103-57 years old: 11 mg per day.  16-75 years old: 8 mg per day.  Women.   50-38 years old: 15 mg per day.  76-63 years old: 18 mg per day.  Over 67 years old: 8 mg per day.  Pregnant women: 27 mg per day.  Breastfeeding women: 9 mg per day. WHAT DO I NEED TO KNOW ABOUT AN IRON-RICH DIET?  Eat fresh fruits and vegetables that are high in vitamin C along with foods that are high in iron. This will help increase the amount of iron that your body  absorbs from food, especially with foods containing nonheme iron. Foods that are high in vitamin C include oranges, peppers, tomatoes, and mango.  Take iron supplements only as directed by your health care provider. Overdose of iron can be life-threatening. If you were prescribed iron supplements, take them with orange juice or a vitamin C supplement.  Cook foods in pots and pans that are made from iron.   Eat nonheme iron-containing foods alongside foods that are high in heme iron. This helps to improve your iron absorption.   Certain foods and drinks contain compounds that impair iron absorption. Avoid eating these foods in the same meal  as iron-rich foods or with iron supplements. These include:  Coffee, black tea, and red wine.  Milk, dairy products, and foods that are high in calcium.  Beans, soybeans, and peas.  Whole grains.  When eating foods that contain both nonheme iron and compounds that impair iron absorption, follow these tips to absorb iron better.   Soak beans overnight before cooking.  Soak whole grains overnight and drain them before using.  Ferment flours before baking, such as using yeast in bread dough. WHAT FOODS CAN I EAT? Grains Iron-fortified breakfast cereal. Iron-fortified whole-wheat bread. Enriched rice. Sprouted grains. Vegetables Spinach. Potatoes with skin. Green peas. Broccoli. Red and green bell peppers. Fermented vegetables. Fruits Prunes. Raisins. Oranges. Strawberries. Mango. Grapefruit. Meats and Other Protein Sources Beef liver. Oysters. Beef. Shrimp. [place]. Chicken. Aquilla. Sardines. Chickpeas. Nuts. Tofu. Beverages Tomato juice. Fresh orange juice. Prune juice. Hibiscus tea. Fortified instant breakfast shakes. Condiments Tahini. Fermented soy sauce. Sweets and Desserts Black-strap molasses.  Other Wheat germ. The items listed above may not be a complete list of recommended foods or beverages. Contact your dietitian for more  options. WHAT FOODS ARE NOT RECOMMENDED? Grains Whole grains. Bran cereal. Bran flour. Oats. Vegetables Artichokes. Brussels sprouts. Kale. Fruits Blueberries. Raspberries. Strawberries. Figs. Meats and Other Protein Sources Soybeans. Products made from soy protein. Dairy Milk. Cream. Cheese. Yogurt. Cottage cheese. Beverages Coffee. Black tea. Red wine. Sweets and Desserts Cocoa. Chocolate. Ice cream. Other Basil. Oregano. Parsley. The items listed above may not be a complete list of foods and beverages to avoid. Contact your dietitian for more information.   This information is not intended to replace advice given to you by your health care provider. Make sure you discuss any questions you have with your health care provider.   Document Released: 12/18/2004 Document Revised: 05/27/2014 Document Reviewed: 12/01/2013 Elsevier Interactive Patient Education 2016 Elsevier Inc.Postpartum Depression and Baby Blues The postpartum period begins right after the birth of a baby. During this time, there is often a great amount of joy and excitement. It is also a time of many changes in the life of the parents. Regardless of how many times a mother gives birth, each child brings new challenges and dynamics to the family. It is not unusual to have feelings of excitement along with confusing shifts in moods, emotions, and thoughts. All mothers are at risk of developing postpartum depression or the "baby blues." These mood changes can occur right after giving birth, or they may occur many months after giving birth. The baby blues or postpartum depression can be mild or severe. Additionally, postpartum depression can go away rather quickly, or it can be a long-term condition.  CAUSES Raised hormone levels and the rapid drop in those levels are thought to be a main cause of postpartum depression and the baby blues. A number of hormones change during and after pregnancy. Estrogen and progesterone  usually decrease right after the delivery of your baby. The levels of thyroid hormone and various cortisol steroids also rapidly drop. Other factors that play a role in these mood changes include major life events and genetics.  RISK FACTORS If you have any of the following risks for the baby blues or postpartum depression, know what symptoms to watch out for during the postpartum period. Risk factors that may increase the likelihood of getting the baby blues or postpartum depression include:  Having a personal or family history of depression.   Having depression while being pregnant.   Having premenstrual mood issues or  mood issues related to oral contraceptives.  Having a lot of life stress.   Having marital conflict.   Lacking a social support network.   Having a baby with special needs.   Having health problems, such as diabetes.  SIGNS AND SYMPTOMS Symptoms of baby blues include:  Brief changes in mood, such as going from extreme happiness to sadness.  Decreased concentration.   Difficulty sleeping.   Crying spells, tearfulness.   Irritability.   Anxiety.  Symptoms of postpartum depression typically begin within the first month after giving birth. These symptoms include:  Difficulty sleeping or excessive sleepiness.   Marked weight loss.   Agitation.   Feelings of worthlessness.   Lack of interest in activity or food.  Postpartum psychosis is a very serious condition and can be dangerous. Fortunately, it is rare. Displaying any of the following symptoms is cause for immediate medical attention. Symptoms of postpartum psychosis include:   Hallucinations and delusions.   Bizarre or disorganized behavior.   Confusion or disorientation.  DIAGNOSIS  A diagnosis is made by an evaluation of your symptoms. There are no medical or lab tests that lead to a diagnosis, but there are various questionnaires that a health care provider may use to identify  those with the baby blues, postpartum depression, or psychosis. Often, a screening tool called the [place] Postnatal Depression Scale is used to diagnose depression in the postpartum period.  TREATMENT The baby blues usually goes away on its own in 1-2 weeks. Social support is often all that is needed. You will be encouraged to get adequate sleep and rest. Occasionally, you may be given medicines to help you sleep.  Postpartum depression requires treatment because it can last several months or longer if it is not treated. Treatment may include individual or group therapy, medicine, or both to address any social, physiological, and psychological factors that may play a role in the depression. Regular exercise, a healthy diet, rest, and social support may also be strongly recommended.  Postpartum psychosis is more serious and needs treatment right away. Hospitalization is often needed. HOME CARE INSTRUCTIONS  Get as much rest as you can. Nap when the baby sleeps.   Exercise regularly. Some women find yoga and walking to be beneficial.   Eat a balanced and nourishing diet.   Do little things that you enjoy. Have a cup of tea, take a bubble bath, read your favorite magazine, or listen to your favorite music.  Avoid alcohol.   Ask for help with household chores, cooking, grocery shopping, or running errands as needed. Do not try to do everything.   Talk to people close to you about how you are feeling. Get support from your partner, family members, friends, or other new moms.  Try to stay positive in how you think. Think about the things you are grateful for.   Do not spend a lot of time alone.   Only take over-the-counter or prescription medicine as directed by your health care provider.  Keep all your postpartum appointments.   Let your health care provider know if you have any concerns.  SEEK MEDICAL CARE IF: You are having a reaction to or problems with your medicine. SEEK  IMMEDIATE MEDICAL CARE IF:  You have suicidal feelings.   You think you may harm the baby or someone else. MAKE SURE YOU:  Understand these instructions.  Will watch your condition.  Will get help right away if you are not doing well or get worse.  This information is not intended to replace advice given to you by your health care provider. Make sure you discuss any questions you have with your health care provider.   Document Released: 02/08/2004 Document Revised: 05/11/2013 Document Reviewed: 02/15/2013 Elsevier Interactive Patient Education 2016 [employer]. Levonorgestrel intrauterine device (IUD) What is this medicine? LEVONORGESTREL IUD (LEE voe nor jes trel) is a contraceptive (birth control) device. The device is placed inside the uterus by a healthcare professional. It is used to prevent pregnancy and can also be used to treat heavy bleeding that occurs during your period. Depending on the device, it can be used for 3 to 5 years. This medicine may be used for other purposes; ask your health care provider or pharmacist if you have questions. What should I tell my health care provider before I take this medicine? They need to know if you have any of these conditions: -abnormal Pap smear -cancer of the breast, uterus, or cervix -diabetes -endometritis -genital or pelvic infection now or in the past -have more than one sexual partner or your partner has more than one partner -heart disease -history of an ectopic or tubal pregnancy -immune system problems -IUD in place -liver disease or tumor -problems with blood clots or take blood-thinners -use intravenous drugs -uterus of unusual shape -vaginal bleeding that has not been explained -an unusual or allergic reaction to levonorgestrel, other hormones, silicone, or polyethylene, medicines, foods, dyes, or preservatives -pregnant or trying to get pregnant -breast-feeding How should I use this medicine? This device is  placed inside the uterus by a health care professional. Talk to your pediatrician regarding the use of this medicine in children. Special care may be needed. Overdosage: If you think you have taken too much of this medicine contact a poison control center or emergency room at once. NOTE: This medicine is only for you. Do not share this medicine with others. What if I miss a dose? This does not apply. What may interact with this medicine? Do not take this medicine with any of the following medications: -amprenavir -bosentan -fosamprenavir This medicine may also interact with the following medications: -aprepitant -barbiturate medicines for inducing sleep or treating seizures -bexarotene -griseofulvin -medicines to treat seizures like carbamazepine, ethotoin, felbamate, oxcarbazepine, phenytoin, topiramate -modafinil -pioglitazone -rifabutin -rifampin -rifapentine -some medicines to treat HIV infection like atazanavir, indinavir, lopinavir, nelfinavir, tipranavir, ritonavir -St. John's wort -warfarin This list may not describe all possible interactions. Give your health care provider a list of all the medicines, herbs, non-prescription drugs, or dietary supplements you use. Also tell them if you smoke, drink alcohol, or use illegal drugs. Some items may interact with your medicine. What should I watch for while using this medicine? Visit your doctor or health care professional for regular check ups. See your doctor if you or your partner has sexual contact with others, becomes HIV positive, or gets a sexual transmitted disease. This product does not protect you against HIV infection (AIDS) or other sexually transmitted diseases. You can check the placement of the IUD yourself by reaching up to the top of your vagina with clean fingers to feel the threads. Do not pull on the threads. It is a good habit to check placement after each menstrual period. Call your doctor right away if you feel  more of the IUD than just the threads or if you cannot feel the threads at all. The IUD may come out by itself. You may become pregnant if the device comes out. If you notice  that the IUD has come out use a backup birth control method like condoms and call your health care provider. Using tampons will not change the position of the IUD and are okay to use during your period. What side effects may I notice from receiving this medicine? Side effects that you should report to your doctor or health care professional as soon as possible: -allergic reactions like skin rash, itching or hives, swelling of the face, lips, or tongue -fever, flu-like symptoms -genital sores -high blood pressure -no menstrual period for 6 weeks during use -pain, swelling, warmth in the leg -pelvic pain or tenderness -severe or sudden headache -signs of pregnancy -stomach cramping -sudden shortness of breath -trouble with balance, talking, or walking -unusual vaginal bleeding, discharge -yellowing of the eyes or skin Side effects that usually do not require medical attention (report to your doctor or health care professional if they continue or are bothersome): -acne -breast pain -change in sex drive or performance -changes in weight -cramping, dizziness, or faintness while the device is being inserted -headache -irregular menstrual bleeding within first 3 to 6 months of use -nausea This list may not describe all possible side effects. Call your doctor for medical advice about side effects. You may report side effects to FDA at 1-800-FDA-1088. Where should I keep my medicine? This does not apply. NOTE: This sheet is a summary. It may not cover all possible information. If you have questions about this medicine, talk to your doctor, pharmacist, or health care provider.    2016, Elsevier/[employer]. (2011-06-06 13:54:04)

## 2016-11-25 IMAGING — US US MFM OB DETAIL+14 WK
2 series · 14 of 28 positions shown · non-contrast
Comparison: none

[Series 1: us mfm ob detail+14 wk · 126 acquisitions, 12 frames shown (1 of 2)]
[im 6/126]
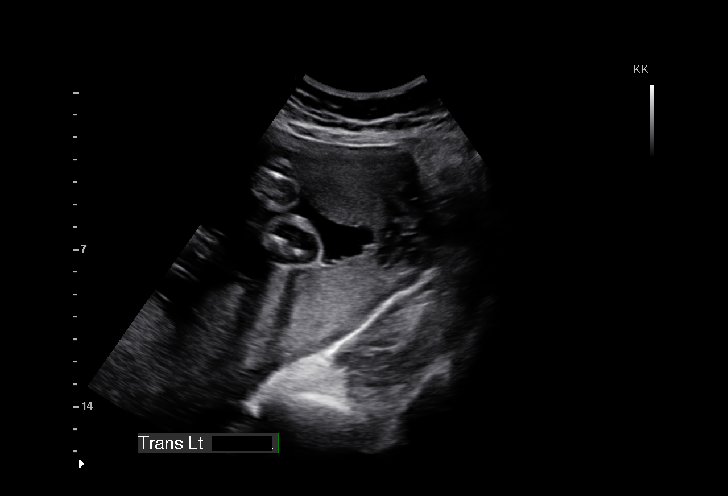
[im 17/126]
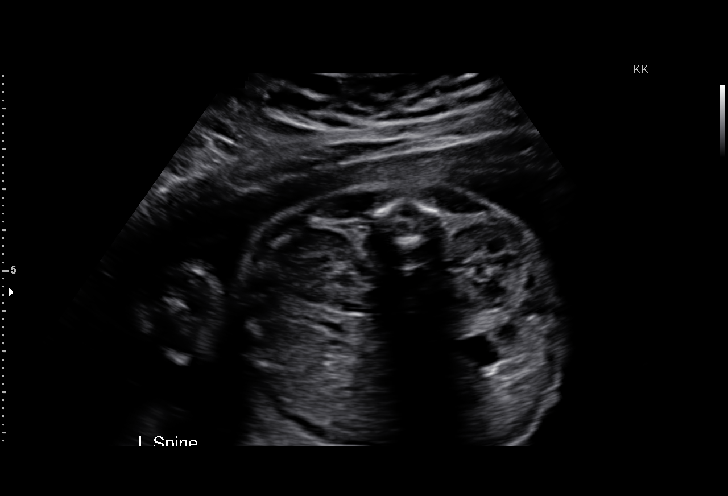
[im 28/126]
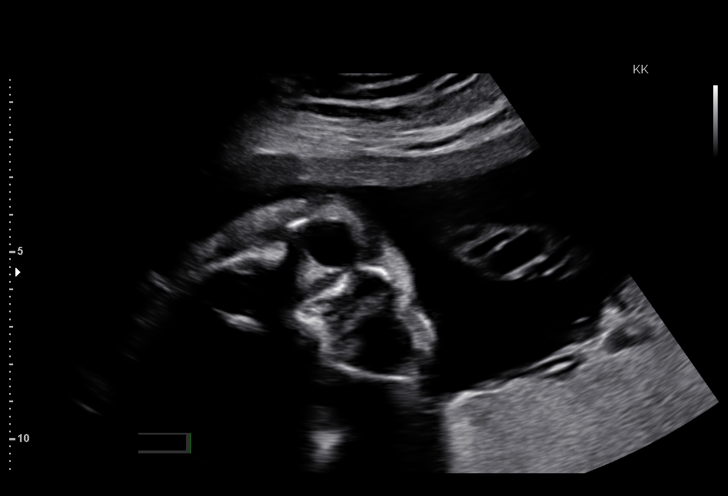
[im 39/126]
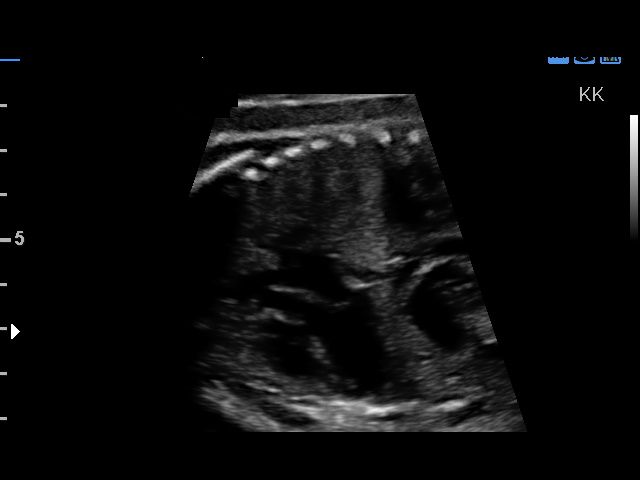
[im 49/126]
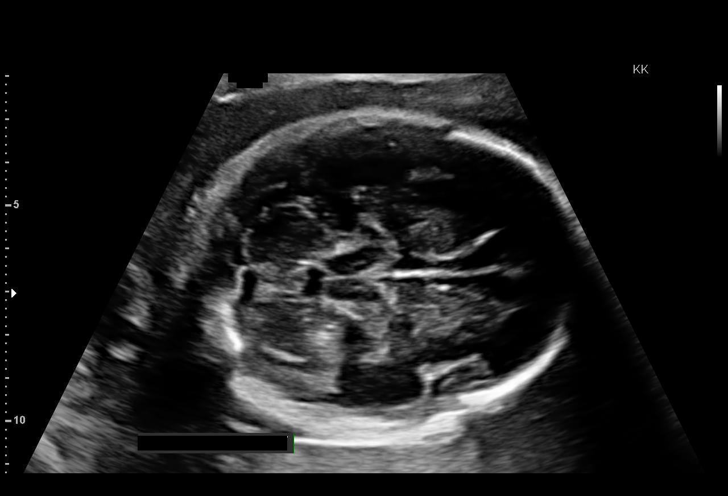
[im 60/126]
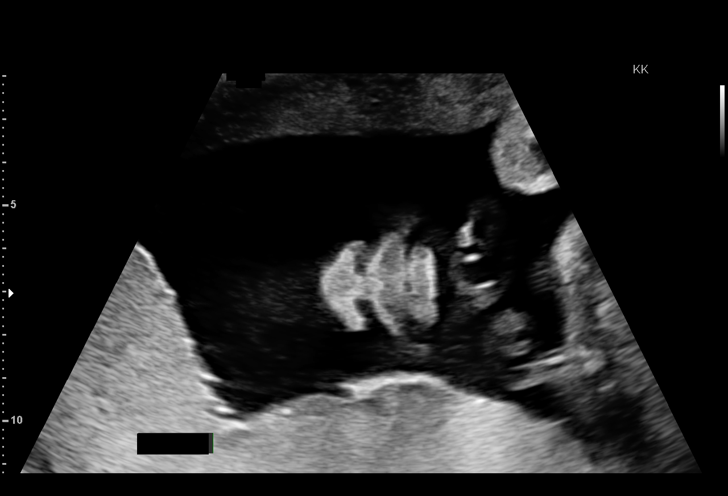
[im 71/126]
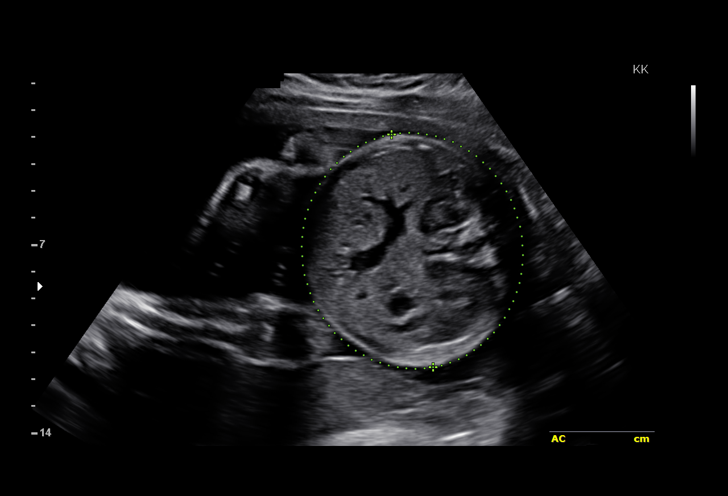
[im 82/126]
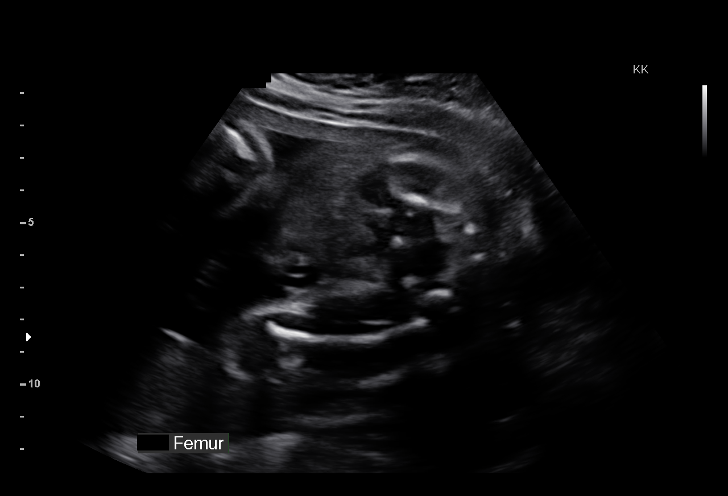
[im 93/126]
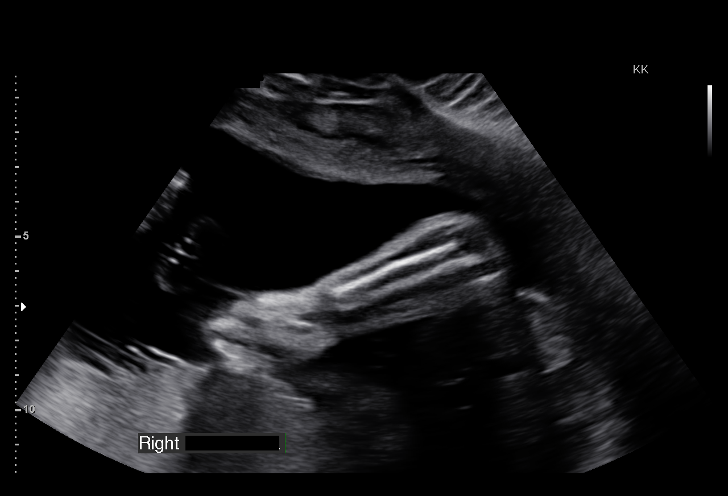
[im 104/126]
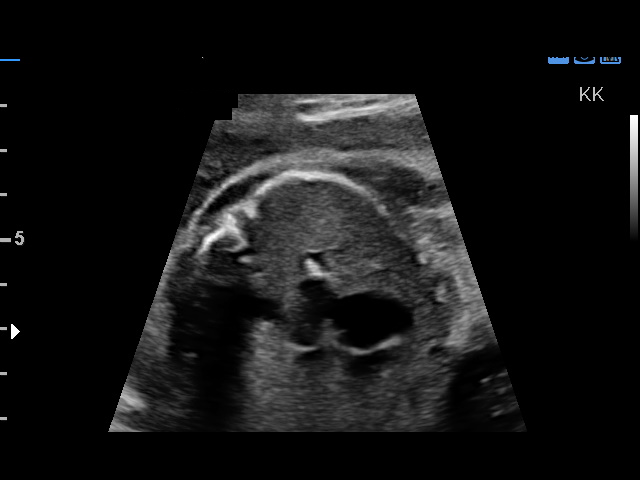
[im 115/126]
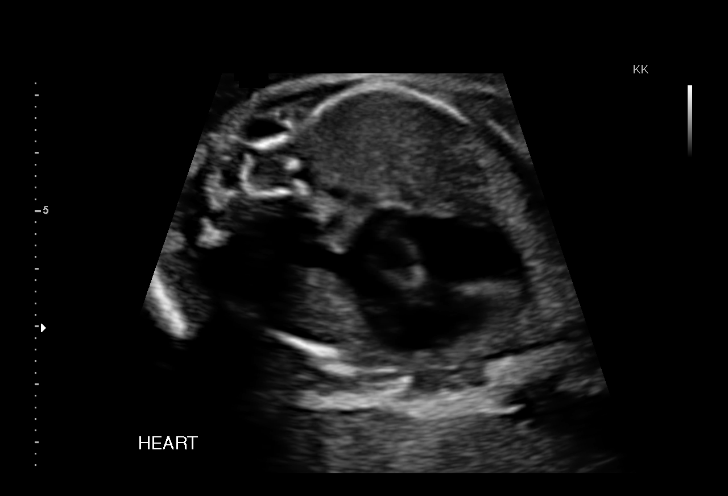
[im 126/126]
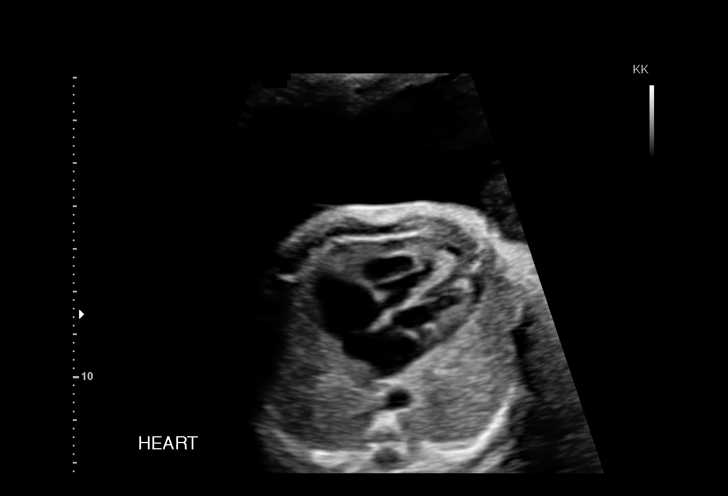

[Series 3: us mfm ob detail+14 wk · 20 acquisitions, 2 frames shown (2 of 2)]
[im 7/20]
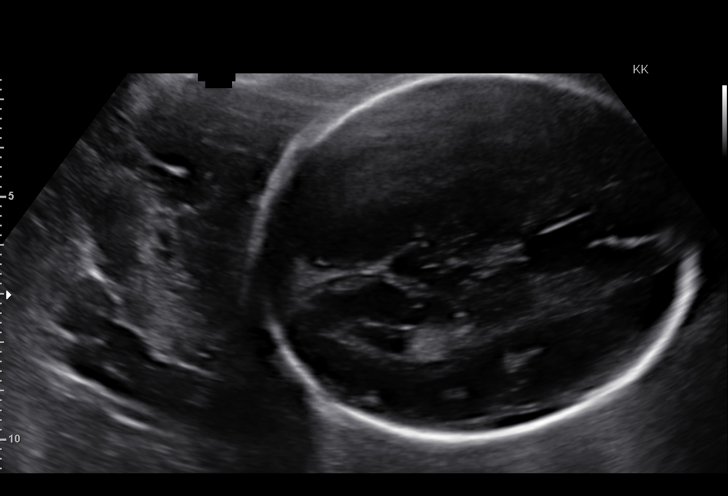
[im 20/20]
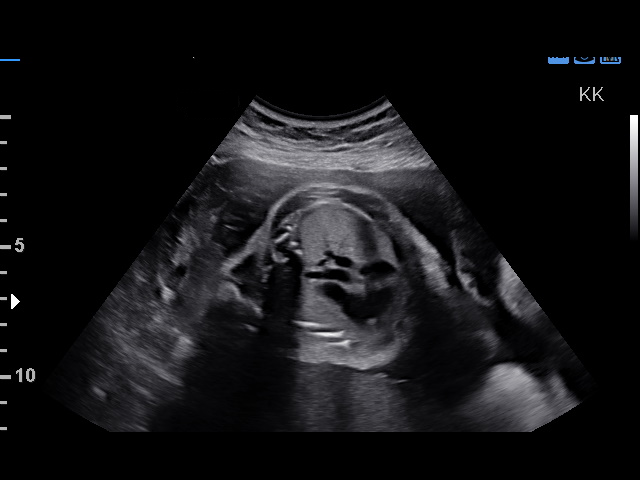

[14 of 28 positions shown; findings below may reference images not displayed]

pm)

Date:

Gynecology
[address]
[place]

1  BLADE AUJLA           [id number]       [id number]     [id number]
Indications

Detailed fetal anatomic survey                  Z36
Hypothyroid
Detailed fetal anatomic survey                  Z36
29 weeks gestation of pregnancy
Fetal abnormality - other known or
suspected - incomplete anatomy and ?
CSP
OB History

Weight:        225
Gravidity:     5         Term:  2
TOP:           2        Living: 2
Fetal Evaluation

Num Of Fetuses:      1
Fetal Heart          133
Rate(bpm):
Cardiac Activity:    Observed
Presentation:        Transverse, head to maternal right
Placenta:            Posterior, above cervical os
P. Cord Insertion:   Visualized
Amniotic Fluid
AFI FV:      Subjectively within normal limits
Larg Pckt:      7.6  cm
Biometry

BPD:      74.6  mm     G. Age:   30w 0d                  CI:        75.72   %    70 - 86
FL/HC:      18.9   %    19.6 -
HC:      271.8  mm     G. Age:   29w 5d        32   %    HC/AC:      1.02        0.99 -
AC:      265.9  mm     G. Age:   30w 5d        85   %    FL/BPD      68.8   %    71 - 87
:
FL:       51.3  mm     G. Age:   27w 3d          5  %    FL/AC:      19.3   %    20 - 24
HUM:      48.3  mm     G. Age:   28w 2d        29   %

Est.        0439   gm    3 lb 2 oz      58   %
FW:
Gestational Age

LMP:           27w 4d        Date:  10/20/14                  EDD:   07/27/15
U/S Today:     29w 3d                                         EDD:   07/14/15
Best:          29w 1d    Det. By:   Early Ultrasound          EDD:   07/16/15
(03/14/15)
Anatomy

Cranium:          Appears normal         Aortic Arch:       Appears normal
Fetal Cavum:      Appears normal         Ductal Arch:       Appears normal
Ventricles:       Appears normal         Diaphragm:         Appears normal
Choroid Plexus:   Appears normal         Stomach:           Appears normal,
left sided
Cerebellum:       Appears normal         Abdomen:           Appears normal
Posterior         Appears normal         Abdominal          Appears nml (cord
Fossa:                                   Wall:              insert, abd wall)
Nuchal Fold:      Not applicable (>20    Cord Vessels:      Appears normal (3
wks GA)                                   vessel cord)
Face:             Appears normal         Kidneys:           Appear normal
(orbits and profile)
Lips:             Appears normal         Bladder:           Appears normal
Fetal Thoracic:   Appears normal         Spine:             Appears normal
Heart:            Appears normal         Upper              Appears normal
(4CH, axis, and        Extremities:
situs)
RVOT:             Appears normal         Lower              Appears normal
Extremities:
LVOT:             Appears normal

Other:   Male gender. Heels and 5th digit visualized. Technically difficult due
to fetal position.
Cervix Uterus Adnexa

Cervix
Length:               4  cm.
Normal appearance by transabdominal scan.

Adnexa:       No abnormality visualized.
Impression

Singleton intrauterine pregnancy at 29 weeks 1 day
gestation with fetal cardiac activity
Transverse presentation
Posterior placenta without evidence of previa
Normal appearing fetal growth and amniotic fluid volume
No apparent birth defects noted on fetal anatomic survey
although examination today limited by fetal position; CSP
appeared normal today
Normal appearing cervical length
Recommendations

Follow-up ultrasounds as clinically indicated.
